# Patient Record
Sex: Male | Born: 1985 | Race: Black or African American | Hispanic: No | Marital: Married | Smoking: Current every day smoker
Health system: Southern US, Community
[De-identification: ages and names within clinical notes are randomized; demographics above are authoritative.]

---

## 2018-11-25 ENCOUNTER — Other Ambulatory Visit: Payer: Self-pay

## 2018-11-25 ENCOUNTER — Emergency Department (HOSPITAL_COMMUNITY)
Admission: EM | Admit: 2018-11-25 | Discharge: 2018-11-25 | Disposition: A | Discharge status: Home / Self Care | Payer: Self-pay | Attending: Emergency Medicine | Admitting: Emergency Medicine

## 2018-11-25 ENCOUNTER — Encounter (HOSPITAL_COMMUNITY): Payer: Self-pay

## 2018-11-25 DIAGNOSIS — R112 Nausea with vomiting, unspecified: Secondary | ICD-10-CM | POA: Insufficient documentation

## 2018-11-25 DIAGNOSIS — R110 Nausea: Secondary | ICD-10-CM

## 2018-11-25 DIAGNOSIS — F17210 Nicotine dependence, cigarettes, uncomplicated: Secondary | ICD-10-CM | POA: Insufficient documentation

## 2018-11-25 LAB — CBC
HCT: 46.4 % (ref 39.0–52.0)
Hemoglobin: 15.6 g/dL (ref 13.0–17.0)
MCH: 33.8 pg (ref 26.0–34.0)
MCHC: 33.6 g/dL (ref 30.0–36.0)
MCV: 100.7 fL — ABNORMAL HIGH (ref 80.0–100.0)
Platelets: 262 10*3/uL (ref 150–400)
RBC: 4.61 MIL/uL (ref 4.22–5.81)
RDW: 13.4 % (ref 11.5–15.5)
WBC: 6.7 10*3/uL (ref 4.0–10.5)
nRBC: 0 % (ref 0.0–0.2)

## 2018-11-25 LAB — COMPREHENSIVE METABOLIC PANEL
ALT: 21 U/L (ref 0–44)
AST: 14 U/L — ABNORMAL LOW (ref 15–41)
Albumin: 4.3 g/dL (ref 3.5–5.0)
Alkaline Phosphatase: 65 U/L (ref 38–126)
Anion gap: 13 (ref 5–15)
BUN: 18 mg/dL (ref 6–20)
CO2: 18 mmol/L — ABNORMAL LOW (ref 22–32)
Calcium: 9.3 mg/dL (ref 8.9–10.3)
Chloride: 109 mmol/L (ref 98–111)
Creatinine, Ser: 0.89 mg/dL (ref 0.61–1.24)
GFR calc Af Amer: >60 mL/min (ref >60)
GFR calc non Af Amer: >60 mL/min (ref >60)
Glucose, Bld: 100 mg/dL — ABNORMAL HIGH (ref 70–99)
Potassium: 3.9 mmol/L (ref 3.5–5.1)
Sodium: 140 mmol/L (ref 135–145)
Total Bilirubin: 0.6 mg/dL (ref 0.3–1.2)
Total Protein: 7.2 g/dL (ref 6.5–8.1)

## 2018-11-25 LAB — LIPASE, BLOOD: Lipase: 31 U/L (ref 11–51)

## 2018-11-25 MED ORDER — SODIUM CHLORIDE 0.9% FLUSH: 3.0000 mL | Freq: Once | INTRAVENOUS | Status: DC

## 2018-11-25 NOTE — ED Triage Notes (Signed)
Pt states that he attended a cookout on Saturday and is concerned that he ate undercooked food. Pt had 1 episode of emesis this morning. Pt states he needs note to return to work

## 2018-11-25 NOTE — ED Notes (Signed)
NO ACTIVE VOMITING SINCE MY ARRIVAL 1700 

## 2018-11-25 NOTE — Discharge Instructions (Signed)
May return to work.  If you develop any body aches, headaches, cough, fever, or other new concerning symptom, recommend staying home from work and depending on severity of your symptoms return to ER or your primary doctor.

## 2018-11-25 NOTE — ED Notes (Signed)
An After Visit Summary was printed and given to the patient. Discharge instructions given and no further questions at this time. Pt denies nausea at this time. Pt states he feels much better.

## 2018-11-25 NOTE — ED Notes (Signed)
[place]

## 2018-11-26 NOTE — ED Provider Notes (Signed)
[facility]-EMERGENCY DEPT Provider Note   CSN: [id number] Arrival date & time: 11/25/18  1257     History   Chief Complaint Chief Complaint  Patient presents with  . Emesis    HPI Justin Case is a 33 y.o. male.  Presents the ER with request of work note.  Patient states couple days ago he thought he had eaten some undercooked food at a barbecue and subsequently had some nausea and a couple episodes of nonbloody nonbilious emesis.  However since yesterday he has had no symptoms, but states he had to come to the emergency department to get a work note.  Patient specifically denies any abdominal pain, cough, fever, shortness of breath, sick contacts.     HPI  History reviewed. No pertinent past medical history.  There are no active problems to display for this patient.   History reviewed. No pertinent surgical history.      Home Medications    Prior to Admission medications   Not on File    Family History No family history on file.  Social History Social History   Tobacco Use  . Smoking status: Current Every Day Smoker    Packs/day: 0.50    Types: Cigarettes  Substance Use Topics  . Alcohol use: Never    Frequency: Never  . Drug use: Not on file     Allergies   Patient has no known allergies.   Review of Systems Review of Systems  Constitutional: Negative for chills and fever.  HENT: Negative for ear pain and sore throat.   Eyes: Negative for pain and visual disturbance.  Respiratory: Negative for cough and shortness of breath.   Cardiovascular: Negative for chest pain and palpitations.  Gastrointestinal: Negative for abdominal pain and vomiting.  Genitourinary: Negative for dysuria and hematuria.  Musculoskeletal: Negative for arthralgias and back pain.  Skin: Negative for color change and rash.  Neurological: Negative for seizures and syncope.  All other systems reviewed and are negative.    Physical Exam Updated Vital  Signs BP 129/74   Pulse 70   Temp 98.8 F (37.1 C) (Oral)   Resp 18   Wt 91.6 kg   SpO2 100%   Physical Exam Vitals signs and nursing note reviewed.  Constitutional:      Appearance: He is well-developed.  HENT:     Head: Normocephalic and atraumatic.  Eyes:     Conjunctiva/sclera: Conjunctivae normal.  Neck:     Musculoskeletal: Neck supple.  Cardiovascular:     Rate and Rhythm: Normal rate and regular rhythm.     Heart sounds: No murmur.  Pulmonary:     Effort: Pulmonary effort is normal. No respiratory distress.     Breath sounds: Normal breath sounds.  Abdominal:     Palpations: Abdomen is soft.     Tenderness: There is no abdominal tenderness.  Skin:    General: Skin is warm and dry.  Neurological:     Mental Status: He is alert.      ED Treatments / Results  Labs (all labs ordered are listed, but only abnormal results are displayed) Labs Reviewed  COMPREHENSIVE METABOLIC PANEL - Abnormal; Notable for the following components:      Result Value   CO2 18 (*)    Glucose, Bld 100 (*)    AST 14 (*)    All other components within normal limits  CBC - Abnormal; Notable for the following components:   MCV 100.7 (*)  All other components within normal limits  LIPASE, BLOOD    EKG None  Radiology No results found.  Procedures Procedures (including critical care time)  Medications Ordered in ED Medications - No data to display   Initial Impression / Assessment and Plan / ED Course  I have reviewed the triage vital signs and the nursing notes.  Pertinent labs & imaging results that were available during my care of the patient were reviewed by me and considered in my medical decision making (see chart for details).        33 year old with episode of nausea and vomiting a couple days ago, now completely asymptomatic.  His vitals are normal he has no physical exam abnormalities.  I see no indication for further emergent diagnostic testing.  No  evidence for infectious symptoms at this time, no COVID symptoms, believe patient is appropriate to return to work at this time.  I reviewed return precautions with patient, will discharge home, provided work-up.    After the discussed management above, the patient was determined to be safe for discharge.  The patient was in agreement with this plan and all questions regarding their care were answered.  ED return precautions were discussed and the patient will return to the ED with any significant worsening of condition.    Final Clinical Impressions(s) / ED Diagnoses   Final diagnoses:  Nausea    ED Discharge Orders    None       Lucrezia Starch, MD 11/26/18 [phone]

## 2019-08-23 ENCOUNTER — Emergency Department (HOSPITAL_COMMUNITY): Payer: Self-pay

## 2019-08-23 ENCOUNTER — Encounter (HOSPITAL_COMMUNITY): Payer: Self-pay

## 2019-08-23 ENCOUNTER — Emergency Department (HOSPITAL_COMMUNITY)
Admission: EM | Admit: 2019-08-23 | Discharge: 2019-08-23 | Disposition: A | Discharge status: Home / Self Care | Payer: Self-pay | Attending: Emergency Medicine | Admitting: Emergency Medicine

## 2019-08-23 ENCOUNTER — Other Ambulatory Visit: Payer: Self-pay

## 2019-08-23 DIAGNOSIS — S0083XA Contusion of other part of head, initial encounter: Secondary | ICD-10-CM | POA: Insufficient documentation

## 2019-08-23 DIAGNOSIS — F17210 Nicotine dependence, cigarettes, uncomplicated: Secondary | ICD-10-CM | POA: Insufficient documentation

## 2019-08-23 DIAGNOSIS — R079 Chest pain, unspecified: Secondary | ICD-10-CM

## 2019-08-23 DIAGNOSIS — W101XXA Fall (on)(from) sidewalk curb, initial encounter: Secondary | ICD-10-CM | POA: Insufficient documentation

## 2019-08-23 DIAGNOSIS — Y999 Unspecified external cause status: Secondary | ICD-10-CM | POA: Insufficient documentation

## 2019-08-23 DIAGNOSIS — S4991XA Unspecified injury of right shoulder and upper arm, initial encounter: Secondary | ICD-10-CM | POA: Insufficient documentation

## 2019-08-23 DIAGNOSIS — Y92009 Unspecified place in unspecified non-institutional (private) residence as the place of occurrence of the external cause: Secondary | ICD-10-CM | POA: Insufficient documentation

## 2019-08-23 DIAGNOSIS — S40211A Abrasion of right shoulder, initial encounter: Secondary | ICD-10-CM | POA: Insufficient documentation

## 2019-08-23 DIAGNOSIS — F10129 Alcohol abuse with intoxication, unspecified: Secondary | ICD-10-CM | POA: Insufficient documentation

## 2019-08-23 DIAGNOSIS — Y9389 Activity, other specified: Secondary | ICD-10-CM | POA: Insufficient documentation

## 2019-08-23 DIAGNOSIS — W19XXXA Unspecified fall, initial encounter: Secondary | ICD-10-CM

## 2019-08-23 DIAGNOSIS — T07XXXA Unspecified multiple injuries, initial encounter: Secondary | ICD-10-CM

## 2019-08-23 DIAGNOSIS — F10920 Alcohol use, unspecified with intoxication, uncomplicated: Secondary | ICD-10-CM

## 2019-08-23 DIAGNOSIS — S0091XA Abrasion of unspecified part of head, initial encounter: Secondary | ICD-10-CM | POA: Insufficient documentation

## 2019-08-23 MED ORDER — SODIUM CHLORIDE 0.9 % IV BOLUS (SEPSIS)
1000.0000 mL | Freq: Once | INTRAVENOUS | Status: AC
  Administered 2019-08-23: 1000 mL via INTRAVENOUS

## 2019-08-23 MED ORDER — DIAZEPAM 5 MG/ML IJ SOLN
2.5000 mg | Freq: Once | INTRAMUSCULAR | Status: AC
  Administered 2019-08-23: 2.5 mg via INTRAVENOUS
  Filled 2019-08-23: qty 2

## 2019-08-23 MED ORDER — HYDROCODONE-ACETAMINOPHEN 5-325 MG PO TABS
1.0000 | ORAL_TABLET | Freq: Once | ORAL | Status: AC
  Administered 2019-08-23: 1 via ORAL
  Filled 2019-08-23: qty 1

## 2019-08-23 MED ORDER — BACITRACIN ZINC 500 UNIT/GM EX OINT
TOPICAL_OINTMENT | Freq: Every day | CUTANEOUS | Status: DC
  Administered 2019-08-23: 1 via TOPICAL
  Filled 2019-08-23: qty 0.9

## 2019-08-23 MED ORDER — SODIUM CHLORIDE 0.9 % IV SOLN
1000.0000 mL | INTRAVENOUS | Status: DC
  Administered 2019-08-23: 1000 mL via INTRAVENOUS

## 2019-08-23 MED ORDER — IBUPROFEN 200 MG PO TABS
400.0000 mg | ORAL_TABLET | Freq: Once | ORAL | Status: AC
  Administered 2019-08-23: 400 mg via ORAL
  Filled 2019-08-23: qty 2

## 2019-08-23 NOTE — ED Provider Notes (Signed)
[facility]-EMERGENCY DEPT Provider Note  CSN: [id number] Arrival date & time: 08/23/19 0430  Chief Complaint(s) Facial Injury  HPI Justin Case is a 34 y.o. male who presents for fall and head trauma while intoxicated resulting in loss of consciousness for approximately 10 minutes.  Fall was witnessed by his brother.  States that he immediately passed out.  States that he is saw his brother stepped off a curb, loses balance which caused him to fall forward onto his right shoulder and face.  His brother picked him up and put him in the car.  He called 911.  His brother was unsure whether the patient was bleeding or not does he started performing CPR.  After short period, the patient came to and was agitated.  Complaining of facial pain right shoulder pain.  Patient is complaining of the above, worse with range of motion and palpation.  No alleviating factors.  HPI  Past Medical History History reviewed. No pertinent past medical history. There are no problems to display for this patient.  Home Medication(s) Prior to Admission medications   Not on File                                                                                                                                    Past Surgical History History reviewed. No pertinent surgical history. Family History No family history on file.  Social History Social History   Tobacco Use   Smoking status: Current Every Day Smoker    Packs/day: 0.50    Types: Cigarettes   Smokeless tobacco: Never Used  Substance Use Topics   Alcohol use: Yes   Drug use: Not Currently   Allergies Patient has no known allergies.  Review of Systems Review of Systems All other systems are reviewed and are negative for acute change except as noted in the HPI  Physical Exam Vital Signs  I have reviewed the triage vital signs BP (!) 138/117 (BP Location: Left Arm)    Pulse (!) 102    Temp (!) 97.4 F (36.3 C) (Oral)     Resp (!) 24    Ht 6' (1.829 m)    Wt 90.7 kg    SpO2 97%    BMI 27.12 kg/m   Physical Exam Constitutional:      General: He is not in acute distress.    Appearance: He is well-developed. He is not diaphoretic.  HENT:     Head: Normocephalic. Abrasion present. No raccoon eyes, Battle's sign or laceration.      Right Ear: External ear normal.     Left Ear: External ear normal.  Eyes:     General: No scleral icterus.       Right eye: No discharge.        Left eye: No discharge.     Conjunctiva/sclera: Conjunctivae normal.     Pupils: Pupils are equal, round, and reactive to  light.  Cardiovascular:     Rate and Rhythm: Regular rhythm.     Pulses:          Radial pulses are 2+ on the right side and 2+ on the left side.       Dorsalis pedis pulses are 2+ on the right side and 2+ on the left side.     Heart sounds: Normal heart sounds. No murmur heard.  No friction rub. No gallop.   Pulmonary:     Effort: Pulmonary effort is normal. No respiratory distress.     Breath sounds: Normal breath sounds. No stridor.  Chest:     Chest wall: Tenderness present.  Abdominal:     General: There is no distension.     Palpations: Abdomen is soft.     Tenderness: There is no abdominal tenderness.  Musculoskeletal:     Right shoulder: Tenderness present. Normal strength.       Arms:     Cervical back: Normal range of motion and neck supple. No bony tenderness.     Thoracic back: Tenderness and bony tenderness present.     Lumbar back: No bony tenderness.       Back:     Comments: Clavicle stable. Chest stable to AP/Lat compression. Pelvis stable to Lat compression. No obvious extremity deformity. No chest or abdominal wall contusion.  Skin:    General: Skin is warm.  Neurological:     Mental Status: He is alert and oriented to person, place, and time.     GCS: GCS eye subscore is 4. GCS verbal subscore is 5. GCS motor subscore is 6.     Comments: Moving all extremities      ED  Results and Treatments Labs (all labs ordered are listed, but only abnormal results are displayed) Labs Reviewed - No data to display                                                                                                                       EKG  EKG Interpretation  Date/Time:  Saturday August 23 2019 05:56:37 EDT Ventricular Rate:  76 PR Interval:    QRS Duration: 81 QT Interval:  373 QTC Calculation: 420 R Axis:   65 Text Interpretation: Sinus rhythm NO STEMI. No old tracing to compare Confirmed by Drema Pry [phone]) on 08/23/2019 6:10:03 AM      Radiology DG Chest 2 View  Result Date: 08/23/2019 CLINICAL DATA:  Pain after fall EXAM: CHEST - 2 VIEW COMPARISON:  None. FINDINGS: The heart size and mediastinal contours are within normal limits. Both lungs are clear. The visualized skeletal structures are unremarkable. IMPRESSION: No active cardiopulmonary disease. Electronically Signed   By: Gerome Sam III M.D   On: 08/23/2019 05:48   DG Thoracic Spine 2 View  Result Date: 08/23/2019 CLINICAL DATA:  Pain after fall EXAM: THORACIC SPINE 2 VIEWS COMPARISON:  None. FINDINGS: There is no evidence of thoracic spine fracture. Alignment is normal. No other  significant bone abnormalities are identified. IMPRESSION: Negative. Electronically Signed   By: Gerome Samavid  Williams III M.D   On: 08/23/2019 05:49   DG Shoulder Right  Result Date: 08/23/2019 CLINICAL DATA:  Pain after fall EXAM: RIGHT SHOULDER - 2+ VIEW COMPARISON:  None. FINDINGS: There is no evidence of fracture or dislocation. There is no evidence of arthropathy or other focal bone abnormality. Soft tissues are unremarkable. IMPRESSION: Negative. Electronically Signed   By: Gerome Samavid  Williams III M.D   On: 08/23/2019 05:49   CT Head Wo Contrast  Result Date: 08/23/2019 CLINICAL DATA:  Fall with loss of consciousness EXAM: CT HEAD WITHOUT CONTRAST CT MAXILLOFACIAL WITHOUT CONTRAST CT CERVICAL SPINE WITHOUT CONTRAST TECHNIQUE:  Multidetector CT imaging of the head, cervical spine, and maxillofacial structures were performed using the standard protocol without intravenous contrast. Multiplanar CT image reconstructions of the cervical spine and maxillofacial structures were also generated. COMPARISON:  None. FINDINGS: CT HEAD FINDINGS Brain: There is no mass, hemorrhage or extra-axial collection. The size and configuration of the ventricles and extra-axial CSF spaces are normal. The brain parenchyma is normal, without evidence of acute or chronic infarction. Vascular: No abnormal hyperdensity of the major intracranial arteries or dural venous sinuses. No intracranial atherosclerosis. Skull: The visualized skull base, calvarium and extracranial soft tissues are normal. CT MAXILLOFACIAL FINDINGS Osseous: --Complex facial fracture types: No LeFort, zygomaticomaxillary complex or nasoorbitoethmoidal fracture. --Simple fracture types: None. --Mandible: No fracture or dislocation. Orbits: The globes are intact. Normal appearance of the intra- and extraconal fat. Symmetric extraocular muscles and optic nerves. Sinuses: Mild left frontal sinus mucosal thickening. Soft tissues: Moderate right facial swelling. CT CERVICAL SPINE FINDINGS Alignment: No static subluxation. Facets are aligned. Occipital condyles and the lateral masses of C1-C2 are aligned. Skull base and vertebrae: No acute fracture. Soft tissues and spinal canal: No prevertebral fluid or swelling. No visible canal hematoma. Disc levels: No advanced spinal canal or neural foraminal stenosis. Upper chest: No pneumothorax, pulmonary nodule or pleural effusion. Other: Normal visualized paraspinal cervical soft tissues. IMPRESSION: 1. No acute intracranial abnormality. 2. Moderate right facial swelling without facial or skull fracture. 3. No acute fracture or static subluxation of the cervical spine. Electronically Signed   By: Deatra RobinsonKevin  Herman M.D.   On: 08/23/2019 06:06   CT Cervical Spine  Wo Contrast  Result Date: 08/23/2019 CLINICAL DATA:  Fall with loss of consciousness EXAM: CT HEAD WITHOUT CONTRAST CT MAXILLOFACIAL WITHOUT CONTRAST CT CERVICAL SPINE WITHOUT CONTRAST TECHNIQUE: Multidetector CT imaging of the head, cervical spine, and maxillofacial structures were performed using the standard protocol without intravenous contrast. Multiplanar CT image reconstructions of the cervical spine and maxillofacial structures were also generated. COMPARISON:  None. FINDINGS: CT HEAD FINDINGS Brain: There is no mass, hemorrhage or extra-axial collection. The size and configuration of the ventricles and extra-axial CSF spaces are normal. The brain parenchyma is normal, without evidence of acute or chronic infarction. Vascular: No abnormal hyperdensity of the major intracranial arteries or dural venous sinuses. No intracranial atherosclerosis. Skull: The visualized skull base, calvarium and extracranial soft tissues are normal. CT MAXILLOFACIAL FINDINGS Osseous: --Complex facial fracture types: No LeFort, zygomaticomaxillary complex or nasoorbitoethmoidal fracture. --Simple fracture types: None. --Mandible: No fracture or dislocation. Orbits: The globes are intact. Normal appearance of the intra- and extraconal fat. Symmetric extraocular muscles and optic nerves. Sinuses: Mild left frontal sinus mucosal thickening. Soft tissues: Moderate right facial swelling. CT CERVICAL SPINE FINDINGS Alignment: No static subluxation. Facets are aligned. Occipital condyles and the lateral masses  of C1-C2 are aligned. Skull base and vertebrae: No acute fracture. Soft tissues and spinal canal: No prevertebral fluid or swelling. No visible canal hematoma. Disc levels: No advanced spinal canal or neural foraminal stenosis. Upper chest: No pneumothorax, pulmonary nodule or pleural effusion. Other: Normal visualized paraspinal cervical soft tissues. IMPRESSION: 1. No acute intracranial abnormality. 2. Moderate right facial  swelling without facial or skull fracture. 3. No acute fracture or static subluxation of the cervical spine. Electronically Signed   By: Deatra Robinson M.D.   On: 08/23/2019 06:06   CT Maxillofacial Wo Contrast  Result Date: 08/23/2019 CLINICAL DATA:  Fall with loss of consciousness EXAM: CT HEAD WITHOUT CONTRAST CT MAXILLOFACIAL WITHOUT CONTRAST CT CERVICAL SPINE WITHOUT CONTRAST TECHNIQUE: Multidetector CT imaging of the head, cervical spine, and maxillofacial structures were performed using the standard protocol without intravenous contrast. Multiplanar CT image reconstructions of the cervical spine and maxillofacial structures were also generated. COMPARISON:  None. FINDINGS: CT HEAD FINDINGS Brain: There is no mass, hemorrhage or extra-axial collection. The size and configuration of the ventricles and extra-axial CSF spaces are normal. The brain parenchyma is normal, without evidence of acute or chronic infarction. Vascular: No abnormal hyperdensity of the major intracranial arteries or dural venous sinuses. No intracranial atherosclerosis. Skull: The visualized skull base, calvarium and extracranial soft tissues are normal. CT MAXILLOFACIAL FINDINGS Osseous: --Complex facial fracture types: No LeFort, zygomaticomaxillary complex or nasoorbitoethmoidal fracture. --Simple fracture types: None. --Mandible: No fracture or dislocation. Orbits: The globes are intact. Normal appearance of the intra- and extraconal fat. Symmetric extraocular muscles and optic nerves. Sinuses: Mild left frontal sinus mucosal thickening. Soft tissues: Moderate right facial swelling. CT CERVICAL SPINE FINDINGS Alignment: No static subluxation. Facets are aligned. Occipital condyles and the lateral masses of C1-C2 are aligned. Skull base and vertebrae: No acute fracture. Soft tissues and spinal canal: No prevertebral fluid or swelling. No visible canal hematoma. Disc levels: No advanced spinal canal or neural foraminal stenosis. Upper  chest: No pneumothorax, pulmonary nodule or pleural effusion. Other: Normal visualized paraspinal cervical soft tissues. IMPRESSION: 1. No acute intracranial abnormality. 2. Moderate right facial swelling without facial or skull fracture. 3. No acute fracture or static subluxation of the cervical spine. Electronically Signed   By: Deatra Robinson M.D.   On: 08/23/2019 06:06    Pertinent labs & imaging results that were available during my care of the patient were reviewed by me and considered in my medical decision making (see chart for details).  Medications Ordered in ED Medications  sodium chloride 0.9 % bolus 1,000 mL (1,000 mLs Intravenous New Bag/Given 08/23/19 0606)    Followed by  0.9 %  sodium chloride infusion (has no administration in time range)  diazepam (VALIUM) injection 2.5 mg (2.5 mg Intravenous Given 08/23/19 0605)  Procedures Procedures  (including critical care time)  Medical Decision Making / ED Course I have reviewed the nursing notes for this encounter and the patient's prior records (if available in EHR or on provided paperwork).   Derel Mcglasson was evaluated in Emergency Department on 08/23/2019 for the symptoms described in the history of present illness. He was evaluated in the context of the global COVID-19 pandemic, which necessitated consideration that the patient might be at risk for infection with the SARS-CoV-2 virus that causes COVID-19. Institutional protocols and algorithms that pertain to the evaluation of patients at risk for COVID-19 are in a state of rapid change based on information released by regulatory bodies including the CDC and federal and state organizations. These policies and algorithms were followed during the patient's care in the ED.  Mechanical fall while intoxicated resulting in head trauma and LOC. Has facial trauma  with right shoulder and chest tenderness. No other injuries noted on exam.  HDS Required mild sedation for agitation  EKG w/o ischemic changes, dysrhythmias, or blocks Imaging negative for any serious injuries.  Provided with IVF and allowed to MTF.      Final Clinical Impression(s) / ED Diagnoses Final diagnoses:  Chest pain  Alcoholic intoxication without complication ([place])  Fall, initial encounter  Contusion of face, initial encounter  Multiple abrasions  Injury of right shoulder, initial encounter      This chart was dictated using voice recognition software.  Despite best efforts to proofread,  errors can occur which can change the documentation meaning.   Fatima Blank, MD 08/23/19 [phone]

## 2019-08-23 NOTE — ED Notes (Signed)
Pt's brother, Caryn Bee, had to leave for work but will be able to pick pt up when he is discharged.  He can be reached at [phone].

## 2019-08-23 NOTE — Discharge Instructions (Addendum)
Clean your wounds with soap and water daily.  Use the arm sling for comfort.  Use ice on the sore spots 3 or 4 times a day, for 2 to 3 days.  Take Tylenol or Motrin, for pain.  There are no other known modifying factors.

## 2019-08-23 NOTE — ED Provider Notes (Signed)
7:45 AM-checkout from Dr. Eudelia Bunch to watch patient, until he becomes stable enough for discharge.  Patient here for suspected injuries from fall, while intoxicated.  He has had comprehensive imaging.  He is not felt to require acute intervention.   1:10 PM-nursing feels like he is ready to go.  They have completed wound care of the right cheek abrasion.  I have reviewed the imaging reports, and he does not have acute injuries requiring intervention.  At this time he is ambulating and has gone to the bathroom without difficulty.  He complains of pain in his right cheek, and his right shoulder.  Right cheek is swollen with a been abrasion which is nonbleeding.  Right shoulder also has an abrasion.  There is no deformity of the right shoulder.  He guards against both active and passive range of motion of the right shoulder secondary to pain.  The patient cannot recall what happened to him.  He agrees that he might have been intoxicated.  I discussed the findings with him and appropriate treatment.  He was given a dose of Motrin and hydrocodone with Tylenol, prior to discharge for his pain.  He was encouraged to use ice on the sore spots 3 or 4 times a day, clean the abrasions well with soap and water daily and return here or see his doctor as needed.    Mancel Bale, MD 08/23/19 1315

## 2019-08-23 NOTE — ED Triage Notes (Signed)
Pt support person sts tripping over a curb and hit face on ground. ETOH. Support person states person was unconscious for 10 minutes and did cpr on patient.

## 2019-08-23 NOTE — ED Notes (Signed)
Pt walked to bathroom but complaining about pain to his right sided

## 2021-11-01 IMAGING — CT CT MAXILLOFACIAL W/O CM
3 series · 15 of 47 positions shown, 18 images · non-contrast
Comparison: None.

CLINICAL DATA: Fall with loss of consciousness

EXAM:
CT HEAD WITHOUT CONTRAST
CT MAXILLOFACIAL WITHOUT CONTRAST
CT CERVICAL SPINE WITHOUT CONTRAST
TECHNIQUE: Multidetector CT imaging of the head, cervical spine, and
maxillofacial structures were performed using the standard protocol
without intravenous contrast. Multiplanar CT image reconstructions
of the cervical spine and maxillofacial structures were also
generated.

[Series 3: max soft · axial · 0.38mm/px · z∈[-264,-114]mm · 9 of 88 slices shown, 12 images]
[im 7/88  brain]
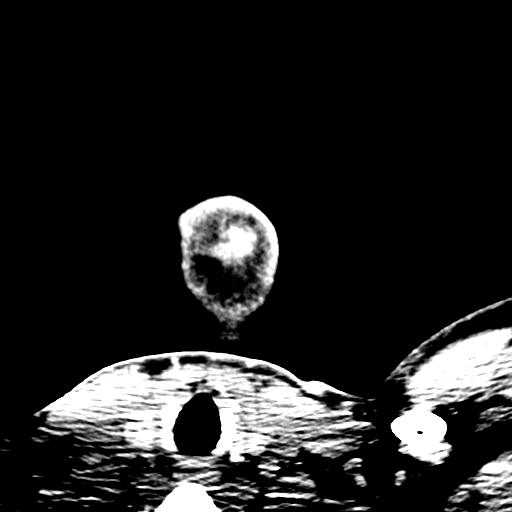
[im 7/88  bone]
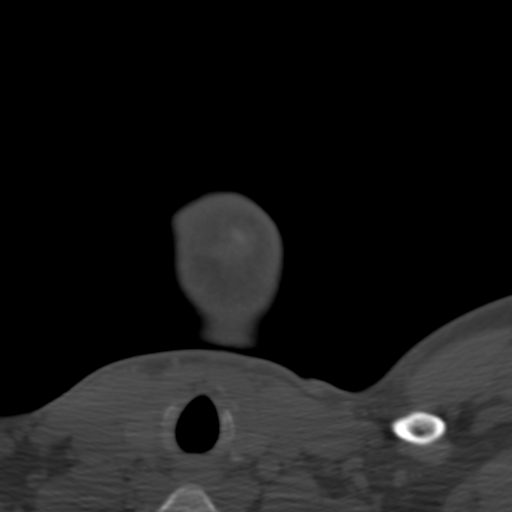
[im 16/88  bone]
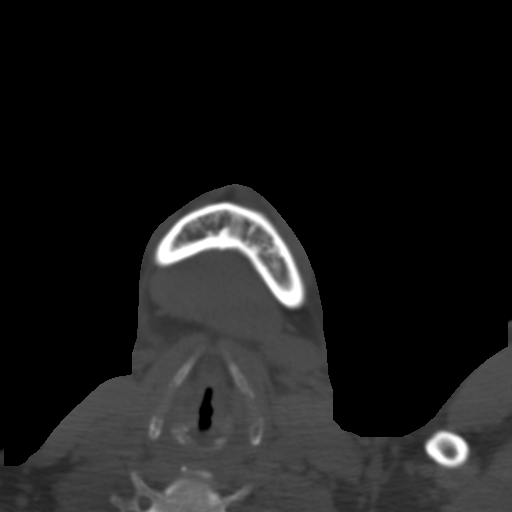
[im 25/88  bone]
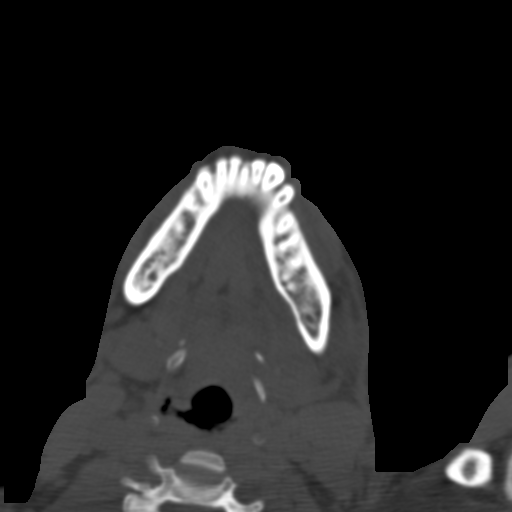
[im 34/88  bone]
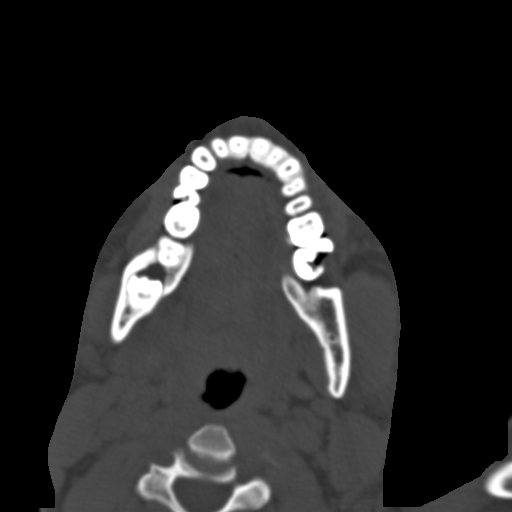
[im 46/88  brain]
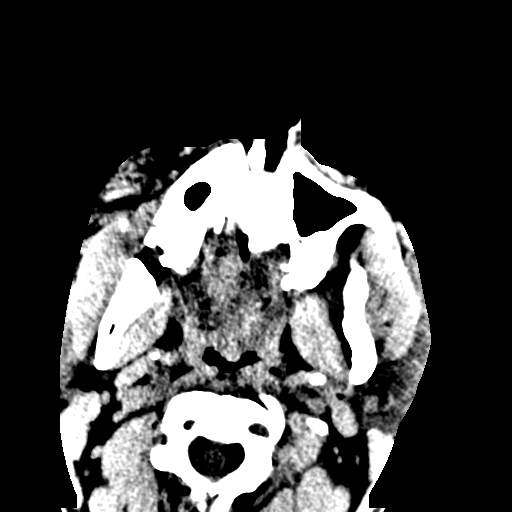
[im 46/88  bone]
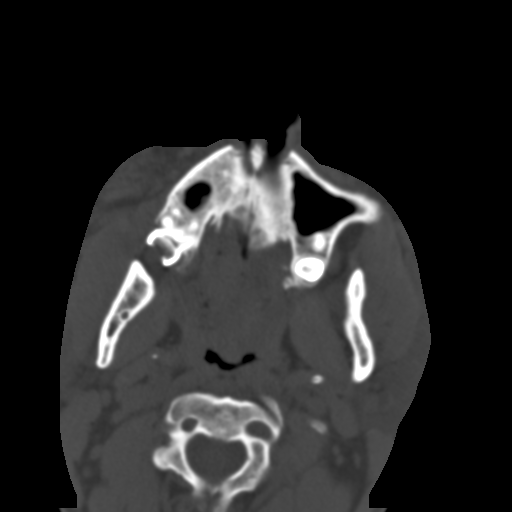
[im 55/88  bone]
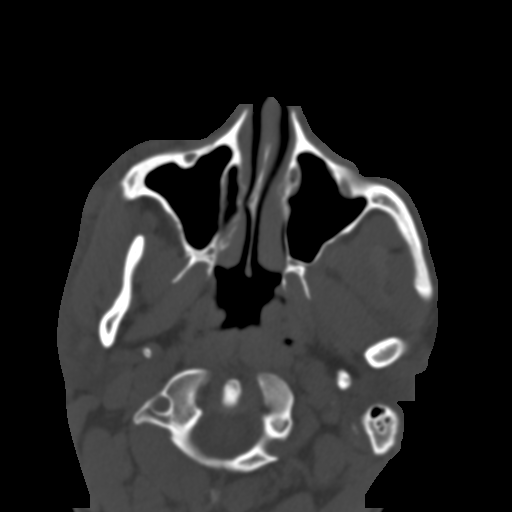
[im 64/88  bone]
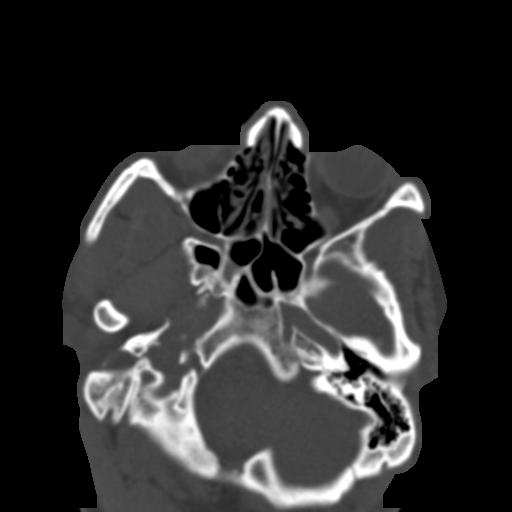
[im 73/88  bone]
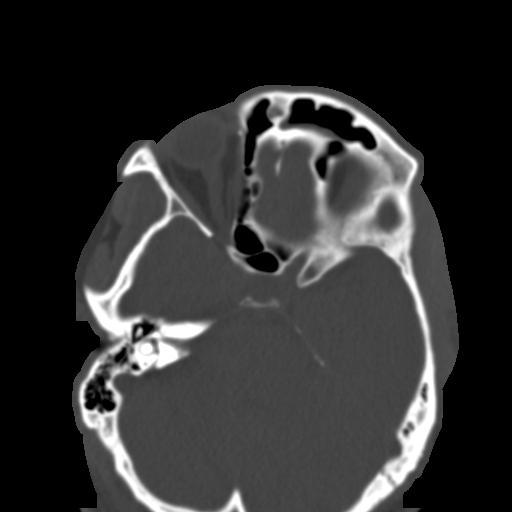
[im 82/88  brain]
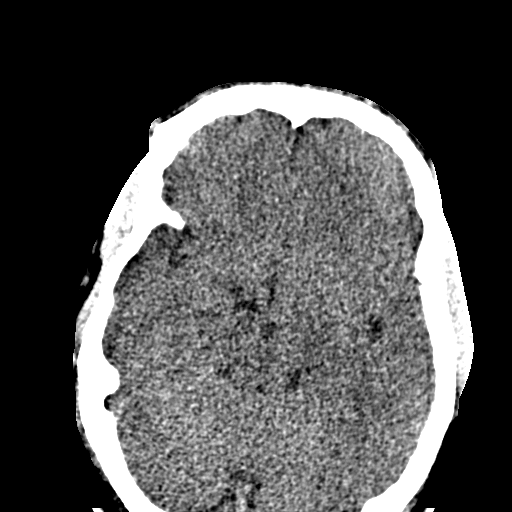
[im 82/88  bone]
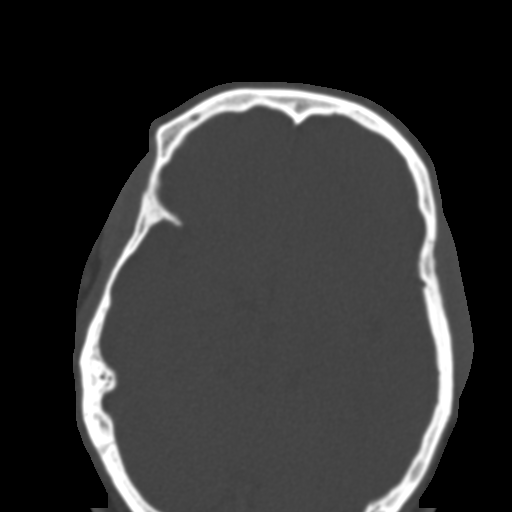

[Series 7: coronal soft · coronal · 0.36mm/px · 3 of 75 slices shown]
[im 25/75  bone]
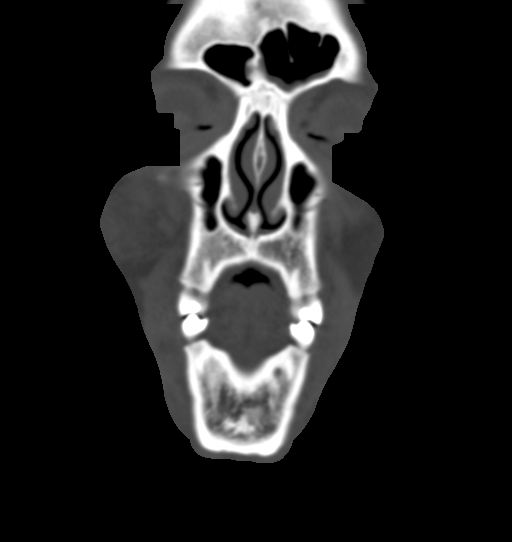
[im 33/75  bone]
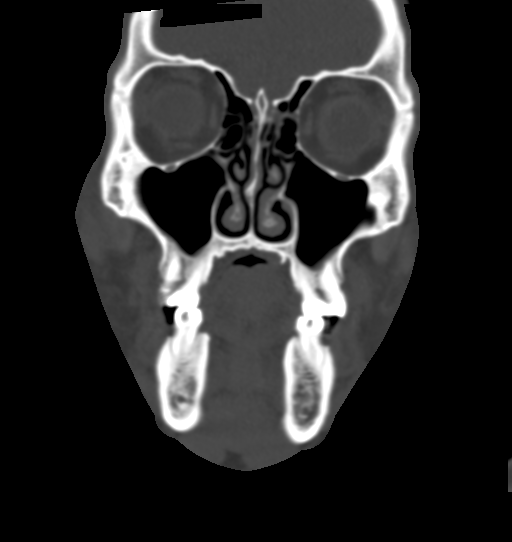
[im 42/75  bone]
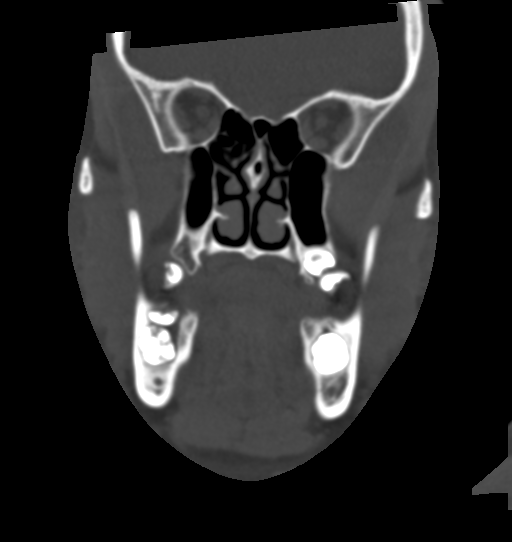

[Series 8: sagittal soft · sagittal · 0.34mm/px · 3 of 88 slices shown]
[im 31/88  bone]
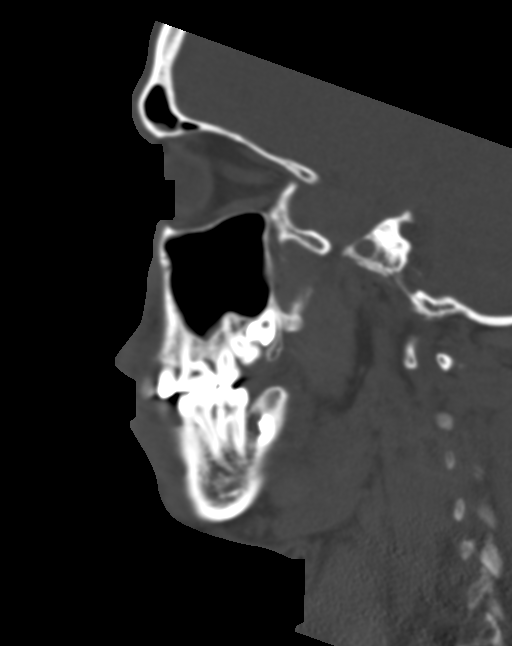
[im 44/88  bone]
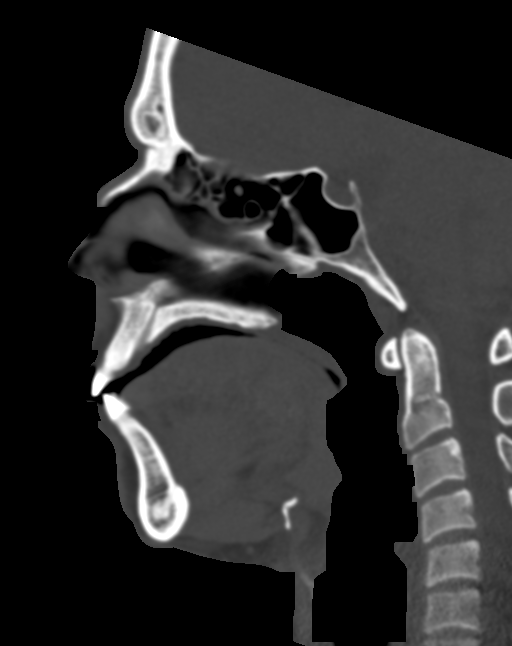
[im 57/88  bone]
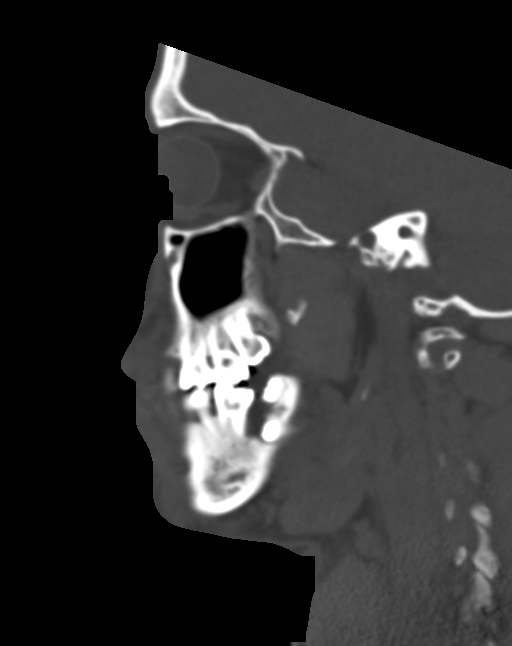

[15 of 47 positions shown; findings below may reference images not displayed]

FINDINGS: CT HEAD FINDINGS

Brain: There is no mass, hemorrhage or extra-axial collection. The
size and configuration of the ventricles and extra-axial CSF spaces
are normal. The brain parenchyma is normal, without evidence of
acute or chronic infarction.

Vascular: No abnormal hyperdensity of the major intracranial
arteries or dural venous sinuses. No intracranial atherosclerosis.

Skull: The visualized skull base, calvarium and extracranial soft
tissues are normal.

CT MAXILLOFACIAL FINDINGS

Osseous:

--Complex facial fracture types: No [place], zygomaticomaxillary
complex or nasoorbitoethmoidal fracture.

--Simple fracture types: None.

--Mandible: No fracture or dislocation.

Orbits: The globes are intact. Normal appearance of the intra- and
extraconal fat. Symmetric extraocular muscles and optic nerves.

Sinuses: Mild left frontal sinus mucosal thickening.

Soft tissues: Moderate right facial swelling.

CT CERVICAL SPINE FINDINGS

Alignment: No static subluxation. Facets are aligned. Occipital
condyles and the lateral masses of C1-C2 are aligned.

Skull base and vertebrae: No acute fracture.

Soft tissues and spinal canal: No prevertebral fluid or swelling. No
visible canal hematoma.

Disc levels: No advanced spinal canal or neural foraminal stenosis.

Upper chest: No pneumothorax, pulmonary nodule or pleural effusion.

Other: Normal visualized paraspinal cervical soft tissues.
IMPRESSION: 1. No acute intracranial abnormality.
2. Moderate right facial swelling without facial or skull fracture.
3. No acute fracture or static subluxation of the cervical spine.

## 2021-11-01 IMAGING — CR DG CHEST 2V
2 series · 2 of 2 positions shown · non-contrast
Comparison: None.

CLINICAL DATA: Pain after fall

EXAM:
CHEST - 2 VIEW

[w chest pa]
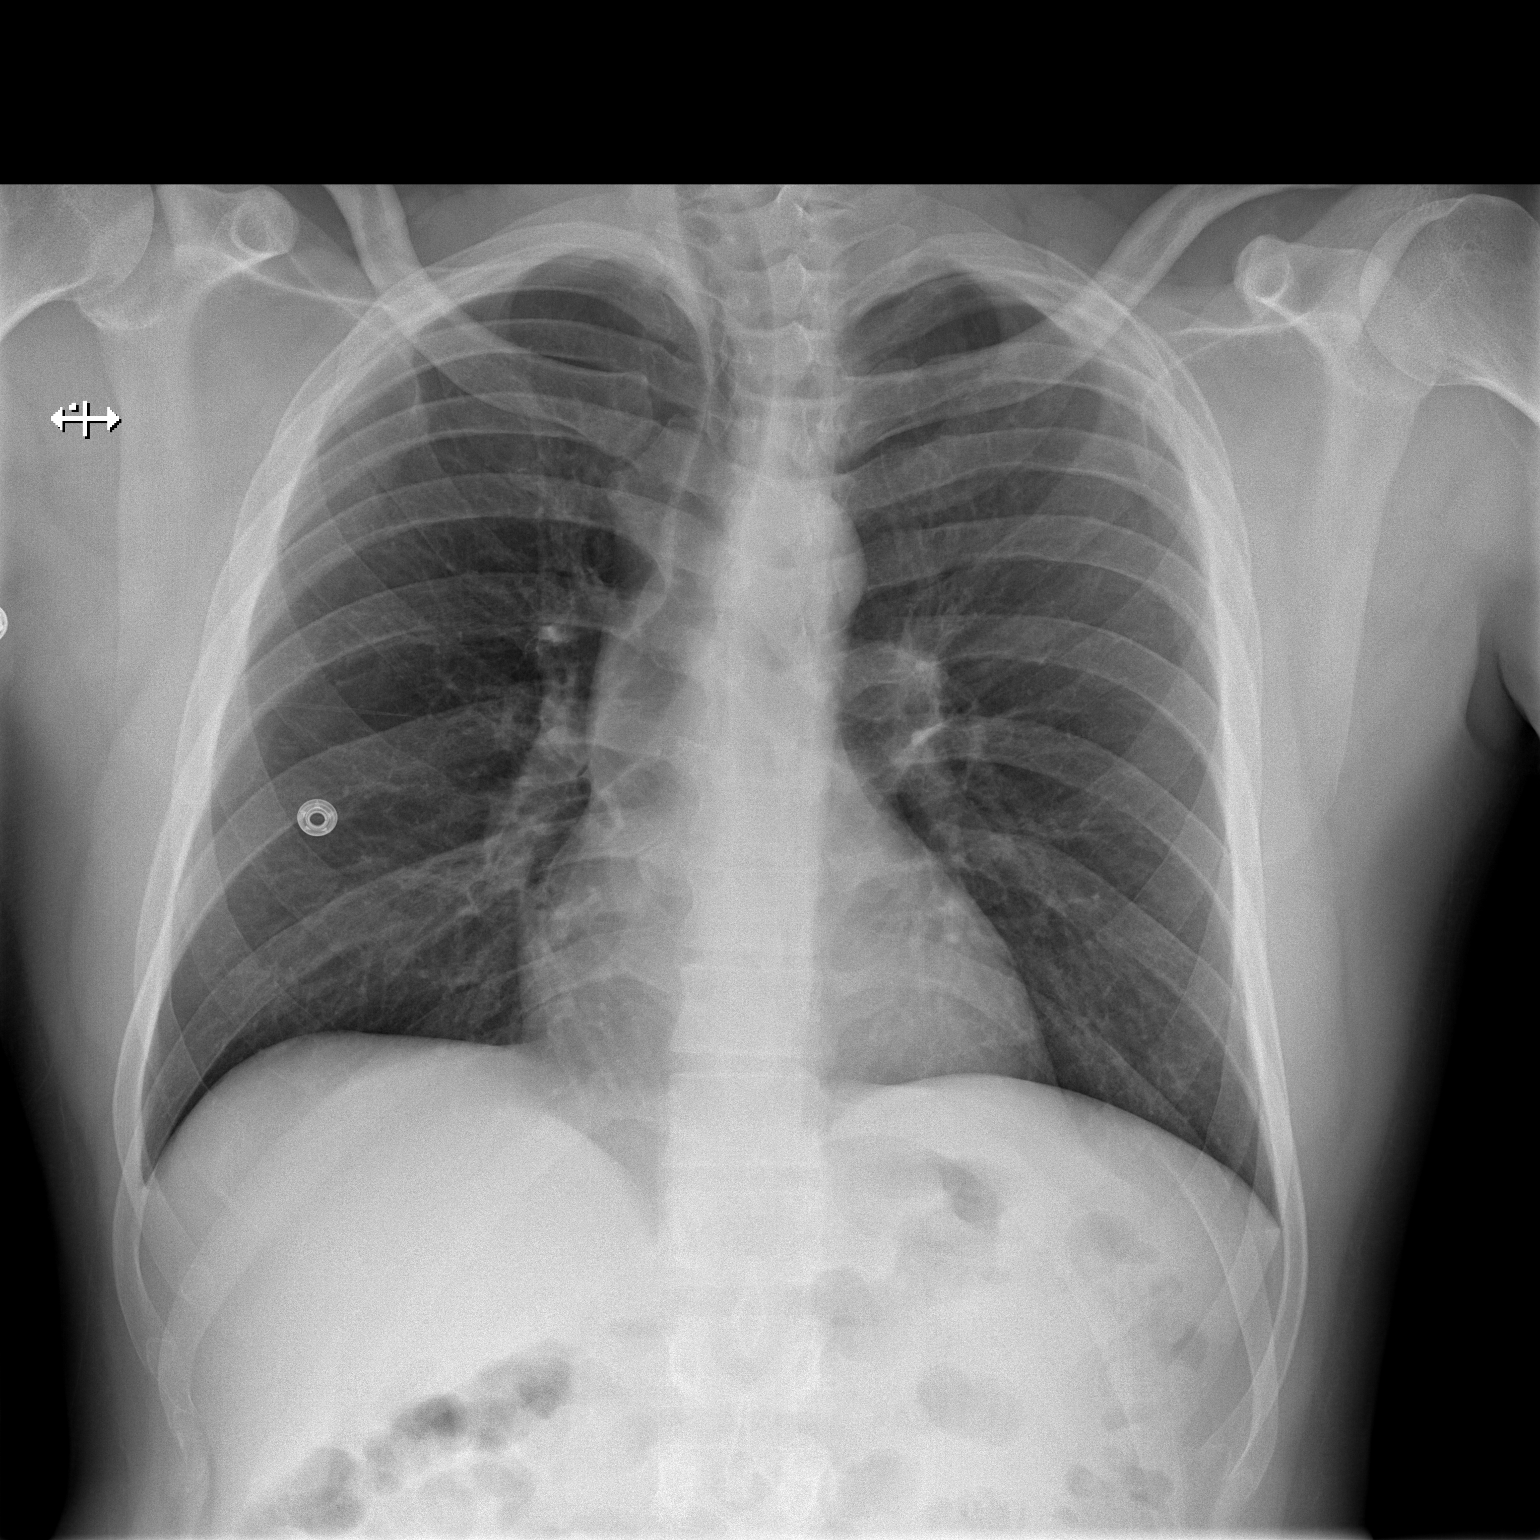

[w chest lat]
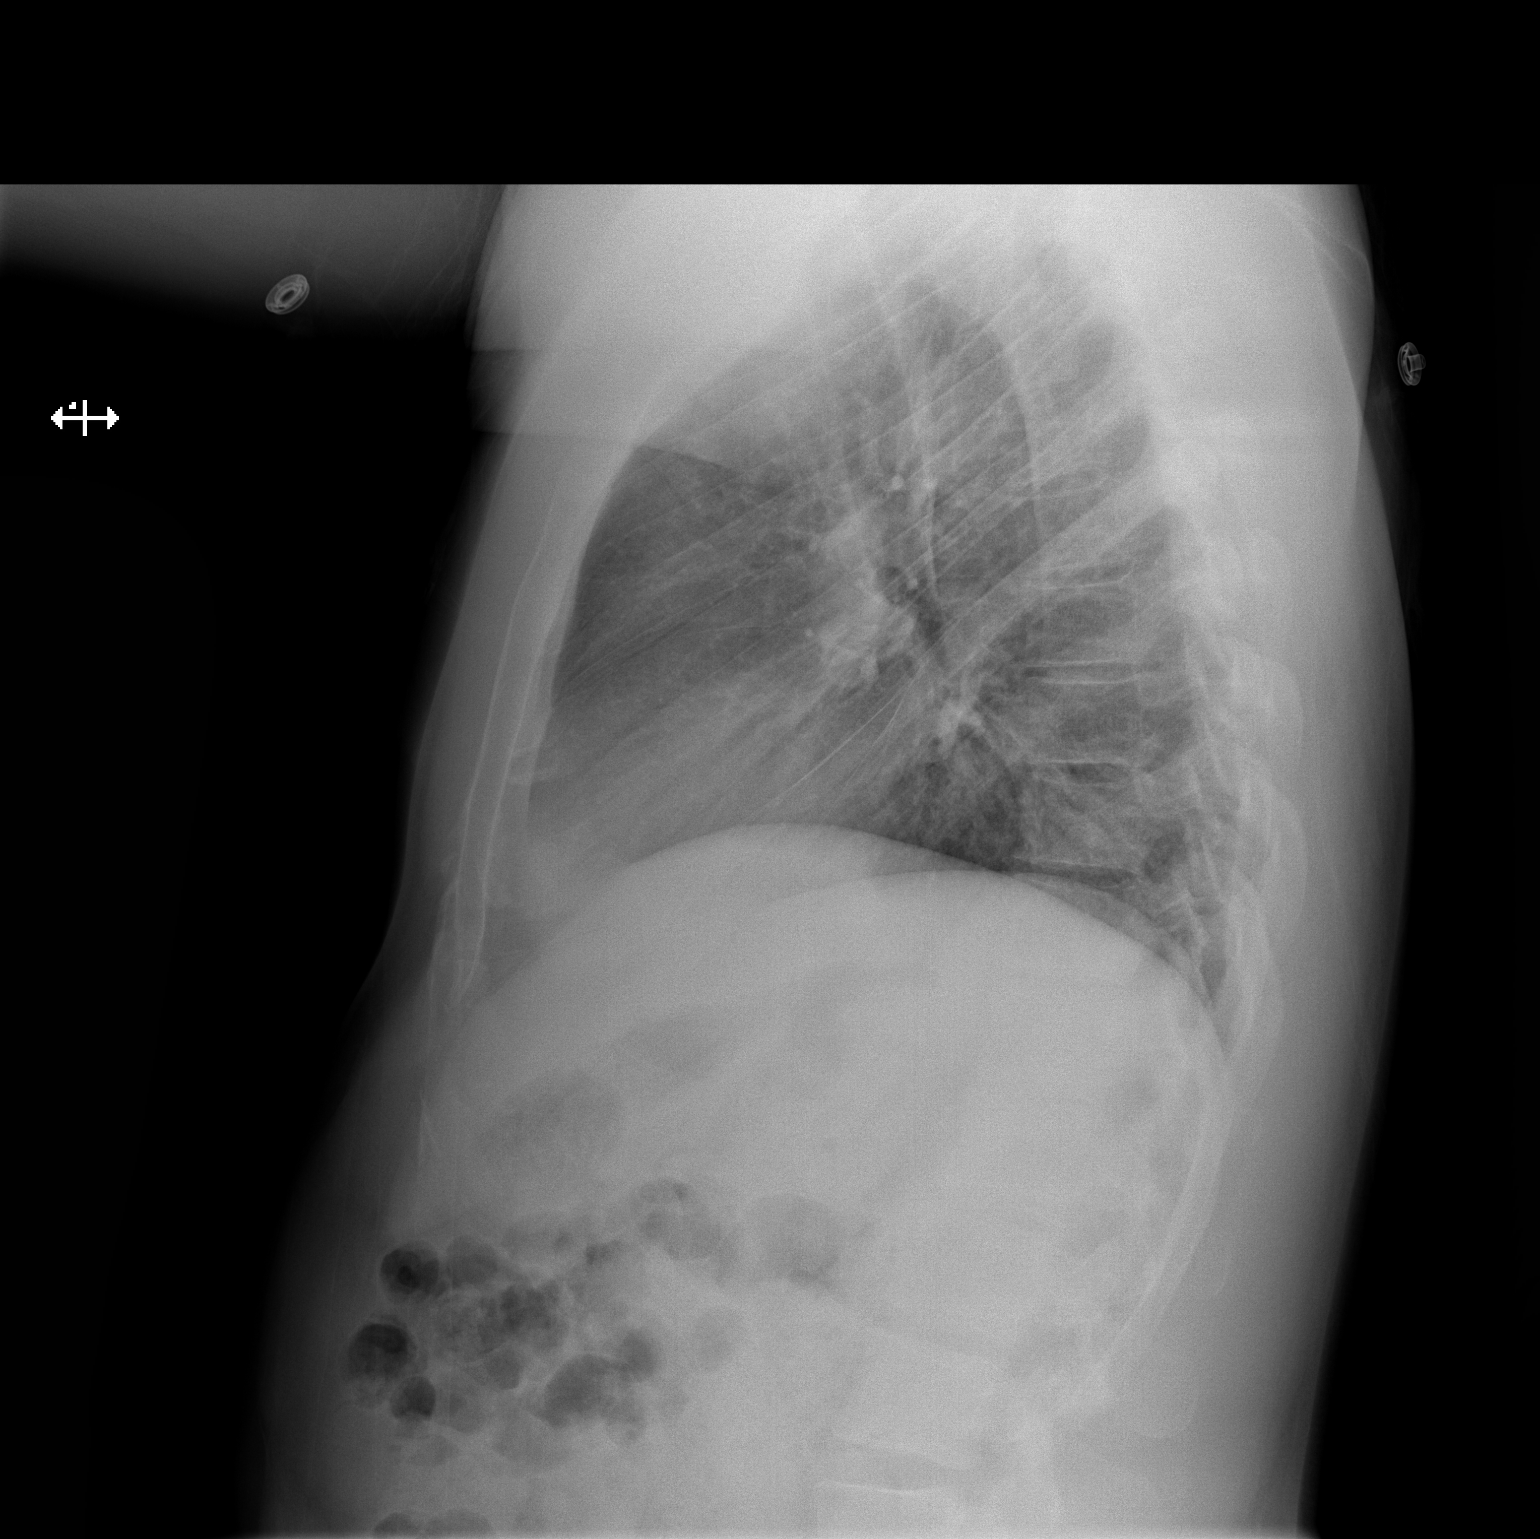

[2 of 2 positions shown; findings below may reference images not displayed]

FINDINGS: The heart size and mediastinal contours are within normal limits.
Both lungs are clear. The visualized skeletal structures are
unremarkable.
IMPRESSION: No active cardiopulmonary disease.
